# Patient Record
Sex: Female | Born: 1975 | Race: White | Hispanic: No | Marital: Married | Smoking: Former smoker
Health system: Southern US, Community
[De-identification: ages and names within clinical notes are randomized; demographics above are authoritative.]

## PROBLEM LIST (undated history)

## (undated) HISTORY — PX: ABDOMINAL HYSTERECTOMY: SHX81

---

## 2013-12-28 ENCOUNTER — Ambulatory Visit: Payer: Self-pay

## 2015-11-05 ENCOUNTER — Ambulatory Visit
Admission: EM | Admit: 2015-11-05 | Discharge: 2015-11-05 | Disposition: A | Discharge status: Home / Self Care | Payer: 59 | Attending: Family Medicine | Admitting: Family Medicine

## 2015-11-05 ENCOUNTER — Encounter: Payer: Self-pay | Admitting: Emergency Medicine

## 2015-11-05 ENCOUNTER — Ambulatory Visit (INDEPENDENT_AMBULATORY_CARE_PROVIDER_SITE_OTHER): Payer: 59

## 2015-11-05 DIAGNOSIS — S40021A Contusion of right upper arm, initial encounter: Secondary | ICD-10-CM

## 2015-11-05 MED ORDER — HYDROCODONE-ACETAMINOPHEN 5-325 MG PO TABS: ORAL_TABLET | ORAL | Status: DC

## 2015-11-05 MED ORDER — KETOROLAC TROMETHAMINE 60 MG/2ML IM SOLN
60.0000 mg | Freq: Once | INTRAMUSCULAR | Status: AC
  Administered 2015-11-05: 60 mg via INTRAMUSCULAR

## 2015-11-05 NOTE — Discharge Instructions (Signed)

## 2015-11-05 NOTE — ED Provider Notes (Signed)
CSN: [id number]     Arrival date & time 11/05/15  1247 History   First MD Initiated Contact with Patient 11/05/15 1328     Chief Complaint  Patient presents with  . Arm Injury   (Consider location/radiation/quality/duration/timing/severity/associated sxs/prior Treatment) Patient is a 40 y.o. female presenting with arm injury. The history is provided by the patient.  Arm Injury Location:  Arm and elbow Time since incident:  4 hours Injury: yes   Mechanism of injury: fall   Fall:    Fall occurred:  Standing   Impact surface:  Hard floor   Point of impact: right elbow and upper arm (humerus)   Entrapped after fall: no   Arm location:  R upper arm Elbow location:  R elbow Pain details:    Quality:  Aching   Radiates to:  Does not radiate   Severity:  Moderate   Onset quality:  Sudden   Timing:  Constant   Progression:  Unchanged Chronicity:  New Handedness:  Right-handed Dislocation: no   Foreign body present:  No foreign bodies Prior injury to area:  No Relieved by:  None tried Ineffective treatments:  None tried Associated symptoms: swelling   Associated symptoms: no decreased range of motion, no numbness and no tingling     History reviewed. No pertinent past medical history. History reviewed. No pertinent past surgical history. No family history on file. Social History  Substance Use Topics  . Smoking status: Current Every Day Smoker  . Smokeless tobacco: None  . Alcohol Use: No   OB History    No data available     Review of Systems  Allergies  Review of patient's allergies indicates no known allergies.  Home Medications   Prior to Admission medications   Medication Sig Start Date End Date Taking? Authorizing Provider  HYDROcodone-acetaminophen (NORCO/VICODIN) 5-325 MG tablet 1-2 tabs po q 8 hours prn 11/05/15   Payton Mccallumrlando Young Mulvey, MD   Meds Ordered and Administered this Visit   Medications  ketorolac (TORADOL) injection 60 mg (60 mg Intramuscular Given  11/05/15 1343)    BP 107/72 mmHg  Pulse 89  Temp(Src) 98.5 F (36.9 C) (Tympanic)  Resp 18  Ht 5\' 6"  (1.676 m)  Wt 125 lb (56.7 kg)  BMI 20.19 kg/m2  SpO2 99% No data found.   Physical Exam  Constitutional: She appears well-developed and well-nourished. No distress.  Musculoskeletal:       Right shoulder: Normal.       Right elbow: She exhibits swelling. She exhibits normal range of motion, no effusion, no deformity and no laceration. Tenderness found. Lateral epicondyle and olecranon process tenderness noted.  Tenderness to palpation over the mid to distal humerus area; right upper extremity neurovascularly intact  Skin: She is not diaphoretic.  Nursing note and vitals reviewed.   ED Course  Procedures (including critical care time)  Labs Review Labs Reviewed - No data to display  Imaging Review Dg Elbow Complete Right  11/05/2015  CLINICAL DATA:  Fall.  Arm pain EXAM: RIGHT ELBOW - COMPLETE 3+ VIEW COMPARISON:  None. FINDINGS: There is no evidence of fracture, dislocation, or joint effusion. There is no evidence of arthropathy or other focal bone abnormality. Soft tissues are unremarkable. IMPRESSION: Negative. Electronically Signed   By: Marlan Palauharles  Clark M.D.   On: 11/05/2015 14:27   Dg Humerus Right  11/05/2015  CLINICAL DATA:  Fall.  Arm pain EXAM: RIGHT HUMERUS - 2+ VIEW COMPARISON:  None. FINDINGS: There is no evidence of  fracture or other focal bone lesions. Soft tissues are unremarkable. IMPRESSION: Negative. Electronically Signed   By: Marlan Palauharles  Clark M.D.   On: 11/05/2015 14:26     Visual Acuity Review  Right Eye Distance:   Left Eye Distance:   Bilateral Distance:    Right Eye Near:   Left Eye Near:    Bilateral Near:         MDM   1. Contusion, arm, upper, right, initial encounter    New Prescriptions   HYDROCODONE-ACETAMINOPHEN (NORCO/VICODIN) 5-325 MG TABLET    1-2 tabs po q 8 hours prn   1. x-ray results (negative) and diagnosis reviewed with  patient 2. rx as per orders above; reviewed possible side effects, interactions, risks and benefits  3. Recommend supportive treatment with rest, ice, otc NSAIDS/analgesics 4. Follow-up prn if symptoms worsen or don't improve    Payton Mccallumrlando Falisha Osment, MD 11/05/15 1440

## 2015-11-05 NOTE — ED Notes (Signed)
Pt presents with upper right ext pain, states she fell today while trying to hang a curtain. No deformity noted, CMS intact.

## 2016-01-21 ENCOUNTER — Ambulatory Visit
Admission: EM | Admit: 2016-01-21 | Discharge: 2016-01-21 | Disposition: A | Discharge status: Home / Self Care | Payer: 59 | Attending: Family Medicine | Admitting: Family Medicine

## 2016-01-21 DIAGNOSIS — J40 Bronchitis, not specified as acute or chronic: Secondary | ICD-10-CM

## 2016-01-21 DIAGNOSIS — M79601 Pain in right arm: Secondary | ICD-10-CM

## 2016-01-21 MED ORDER — PREDNISONE 10 MG (21) PO TBPK: ORAL_TABLET | ORAL | 0 refills | Status: DC

## 2016-01-21 MED ORDER — ALBUTEROL SULFATE HFA 108 (90 BASE) MCG/ACT IN AERS: 2.0000 | INHALATION_SPRAY | Freq: Four times a day (QID) | RESPIRATORY_TRACT | 0 refills | Status: AC | PRN

## 2016-01-21 MED ORDER — BENZONATATE 100 MG PO CAPS: 100.0000 mg | ORAL_CAPSULE | Freq: Three times a day (TID) | ORAL | 0 refills | Status: AC | PRN

## 2016-01-21 NOTE — ED Triage Notes (Signed)
Patient complains of a cough that started around 5 days ago. Patient states that she is also having right arm pain, was seen here after falling 2 months ago. Reports that when she started coughing she started having the arm pain again and she has been unable to move arm at certain angles. Patient states that the pain is keeping her up at night.

## 2016-01-21 NOTE — ED Provider Notes (Signed)
CSN: [id number]     Arrival date & time 01/21/16  1323 History   First MD Initiated Contact with Patient 01/21/16 1341     Chief Complaint  Patient presents with  . Cough  . Arm Pain   (Consider location/radiation/quality/duration/timing/severity/associated sxs/prior Treatment) 40 year old female presents with cough and chest congestion for the past 5 to 6 days. She also had nasal congestion and ear pain which has resolved. She denies any fever or GI symptoms. She also complains of right arm/shoulder pain for the past 4 days. This started after a bad coughing episode. The pain is worse at night. She was unable to move her right arm due to pain at first but now she can raise her arm mid-way. She has taken OTC cold medication and Ibuprofen for pain with minimal relief. She did fall and injure her right arm about 3 months ago but had fully recovered until now.    The history is provided by the patient.  Cough  Cough characteristics:  Productive Sputum characteristics:  Yellow Severity:  Moderate Onset quality:  Gradual Duration:  6 days Timing:  Constant Progression:  Worsening Chronicity:  New Smoker: yes   Context: upper respiratory infection   Relieved by:  Nothing Worsened by:  Lying down Ineffective treatments:  Rest and cough suppressants Associated symptoms: ear pain, sinus congestion and wheezing   Associated symptoms: no chest pain, no chills, no fever, no headaches, no rash, no shortness of breath and no sore throat   Arm Pain  Pertinent negatives include no chest pain, no abdominal pain, no headaches and no shortness of breath.    History reviewed. No pertinent past medical history. Past Surgical History:  Procedure Laterality Date  . ABDOMINAL HYSTERECTOMY     History reviewed. No pertinent family history. Social History  Substance Use Topics  . Smoking status: Current Every Day Smoker    Packs/day: 0.50  . Smokeless tobacco: Never Used  . Alcohol use No   OB  History    No data available     Review of Systems  Constitutional: Positive for fatigue. Negative for activity change, chills and fever.  HENT: Positive for congestion and ear pain. Negative for sinus pressure and sore throat.   Respiratory: Positive for cough and wheezing. Negative for chest tightness and shortness of breath.   Cardiovascular: Negative for chest pain.  Gastrointestinal: Negative for abdominal pain, diarrhea, nausea and vomiting.  Musculoskeletal: Positive for arthralgias (right arm/shoulder). Negative for neck pain and neck stiffness.  Skin: Negative for rash.  Neurological: Negative for dizziness, numbness and headaches.    Allergies  Review of patient's allergies indicates no known allergies.  Home Medications   Prior to Admission medications   Medication Sig Start Date End Date Taking? Authorizing Provider  estrogens conjugated, synthetic A, (CENESTIN) 1.25 MG tablet Take 1.25 mg by mouth daily.   Yes Historical Provider, MD  albuterol (PROVENTIL HFA;VENTOLIN HFA) 108 (90 Base) MCG/ACT inhaler Inhale 2 puffs into the lungs every 6 (six) hours as needed for wheezing. 01/21/16   Sudie Grumbling, NP  benzonatate (TESSALON) 100 MG capsule Take 1 capsule (100 mg total) by mouth 3 (three) times daily as needed for cough. 01/21/16   Sudie Grumbling, NP  predniSONE (STERAPRED UNI-PAK 21 TAB) 10 MG (21) TBPK tablet Take 6 tabs by mouth daily  for 2 days, then 5 tabs for 2 days, then 4 tabs for 2 days, then 3 tabs for 2 days, 2 tabs for  2 days, then 1 tab by mouth daily until finished. 01/21/16   Sudie GrumblingAnn Berry Shivonne Schwartzman, NP   Meds Ordered and Administered this Visit  Medications - No data to display  BP 103/70 (BP Location: Left Arm)   Pulse 81   Temp 98.1 F (36.7 C) (Oral)   Resp 16   Ht 5\' 7"  (1.702 m)   Wt 125 lb (56.7 kg)   SpO2 95%   BMI 19.58 kg/m  No data found.   Physical Exam  Constitutional: She is oriented to person, place, and time. She appears well-developed  and well-nourished. She is cooperative. No distress.  HENT:  Head: Normocephalic and atraumatic.  Right Ear: Hearing, tympanic membrane, external ear and ear canal normal.  Left Ear: Hearing, tympanic membrane, external ear and ear canal normal.  Nose: Rhinorrhea present. Right sinus exhibits no maxillary sinus tenderness and no frontal sinus tenderness. Left sinus exhibits no maxillary sinus tenderness and no frontal sinus tenderness.  Mouth/Throat: Uvula is midline, oropharynx is clear and moist and mucous membranes are normal.  Neck: Trachea normal, normal range of motion and full passive range of motion without pain. Neck supple.  Cardiovascular: Normal rate, regular rhythm and normal heart sounds.   Pulmonary/Chest: Effort normal. She has decreased breath sounds. She has wheezes in the right upper field, the right middle field and the right lower field. She has no rhonchi. She has no rales.  Musculoskeletal: She exhibits tenderness.       Right shoulder: She exhibits decreased range of motion, tenderness and pain. She exhibits no swelling, no effusion, no crepitus, no deformity, no spasm, normal pulse and normal strength.  Right arm tender along deltoid and triceps muscles. Has marked decreased range of motion of right shoulder- only able to abduct about 40 degrees. Has full range of motion of elbow and wrist. Also tender along right trapezius muscle. No distinct muscle spasms present. Pulses are normal and no neuro deficits noted.   Lymphadenopathy:    She has no cervical adenopathy.  Neurological: She is alert and oriented to person, place, and time. She has normal strength and normal reflexes. No sensory deficit.  Skin: Skin is warm and dry. Capillary refill takes less than 2 seconds.  Psychiatric: Her behavior is normal. Judgment and thought content normal. Her mood appears anxious. Her speech is rapid and/or pressured. Cognition and memory are normal.    Urgent Care Course   Clinical  Course    Procedures (including critical care time)  Labs Review Labs Reviewed - No data to display  Imaging Review No results found.   Visual Acuity Review  Right Eye Distance:   Left Eye Distance:   Bilateral Distance:    Right Eye Near:   Left Eye Near:    Bilateral Near:         MDM   1. Bronchitis   2. Right arm pain    Discussed with patient that she probably has bronchitis- most likely viral. Recommend Albuterol inhaler 2 puffs every 6 hours as needed for wheezing and Tessalon perles 3 times a day as needed for cough. Encouraged to decrease/stop smoking. Reviewed that arm pain is probably due to muscle strain and nerve pain from coughing or sleeping on arm/shoulder incorrectly. Recommend try Prednisone 10mg  12 day dose pack as directed. Encouraged to continue to move arm/shoulder as tolerated. Discussed imaging not necessary at this time. Recommend follow-up with a primary care provider within 1 week if symptoms are not improving or go  to ER if symptoms worsen.    Sudie Grumbling, NP 01/22/16 [phone]

## 2016-01-21 NOTE — Discharge Instructions (Signed)
Start Prednisone 6 tablets today and take as directed. Take Tessalon perles (cough pills) every 8 hours as needed for cough. Use Albuterol inhaler every 6 hours as needed for cough and wheezing. Follow-up with a primary care provider if not improving within 3 to 4 days.

## 2016-11-12 IMAGING — CR DG ELBOW COMPLETE 3+V*R*
4 series · 4 of 4 positions shown · non-contrast
Comparison: None.

CLINICAL DATA: Fall.  Arm pain

EXAM:
RIGHT ELBOW - COMPLETE 3+ VIEW

[elbow ap]
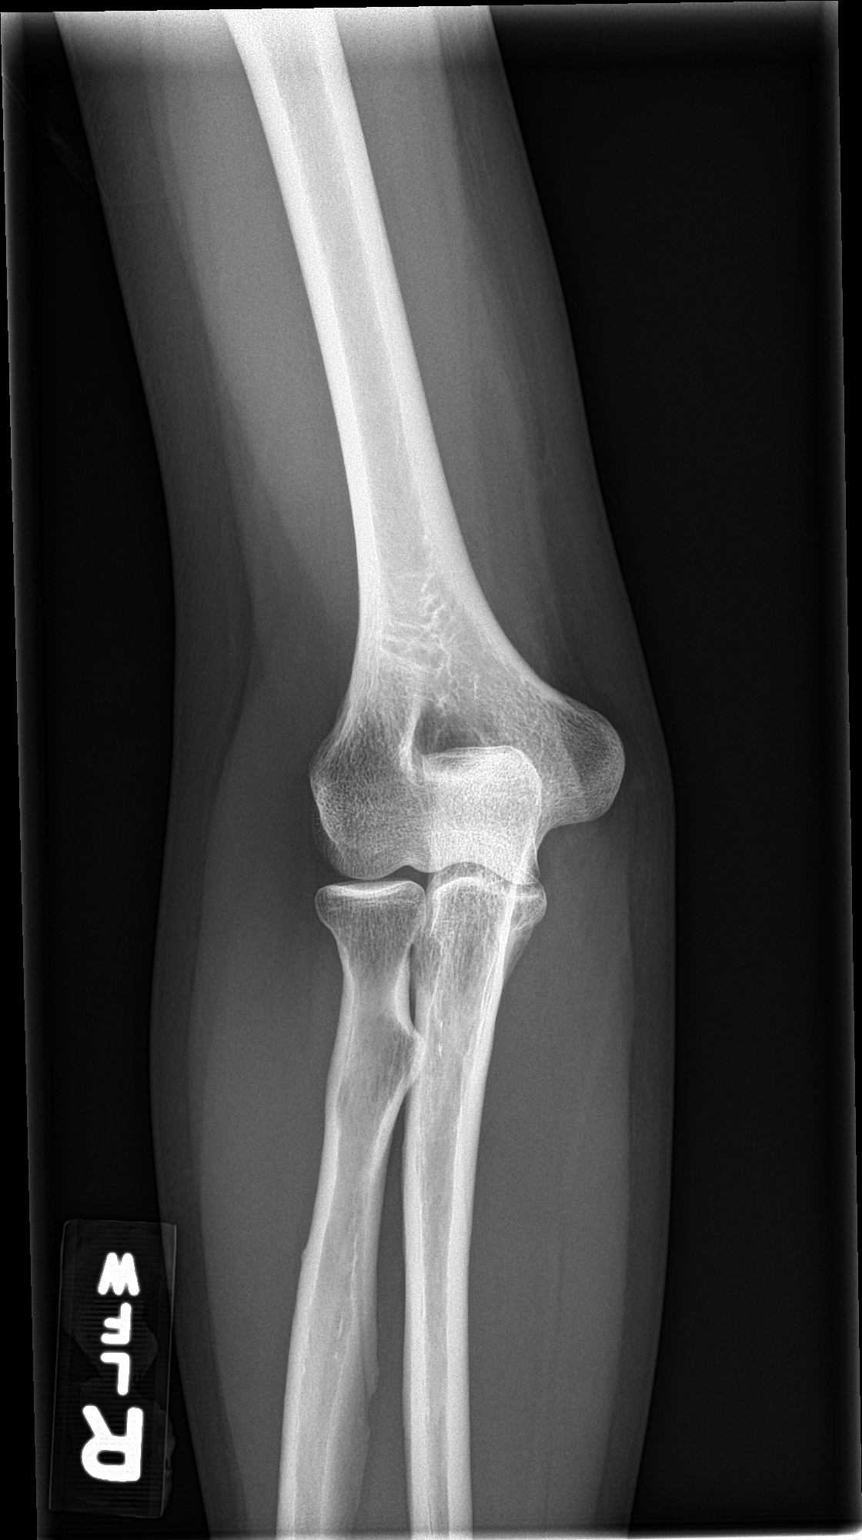

[elbow obl (1 of 2)]
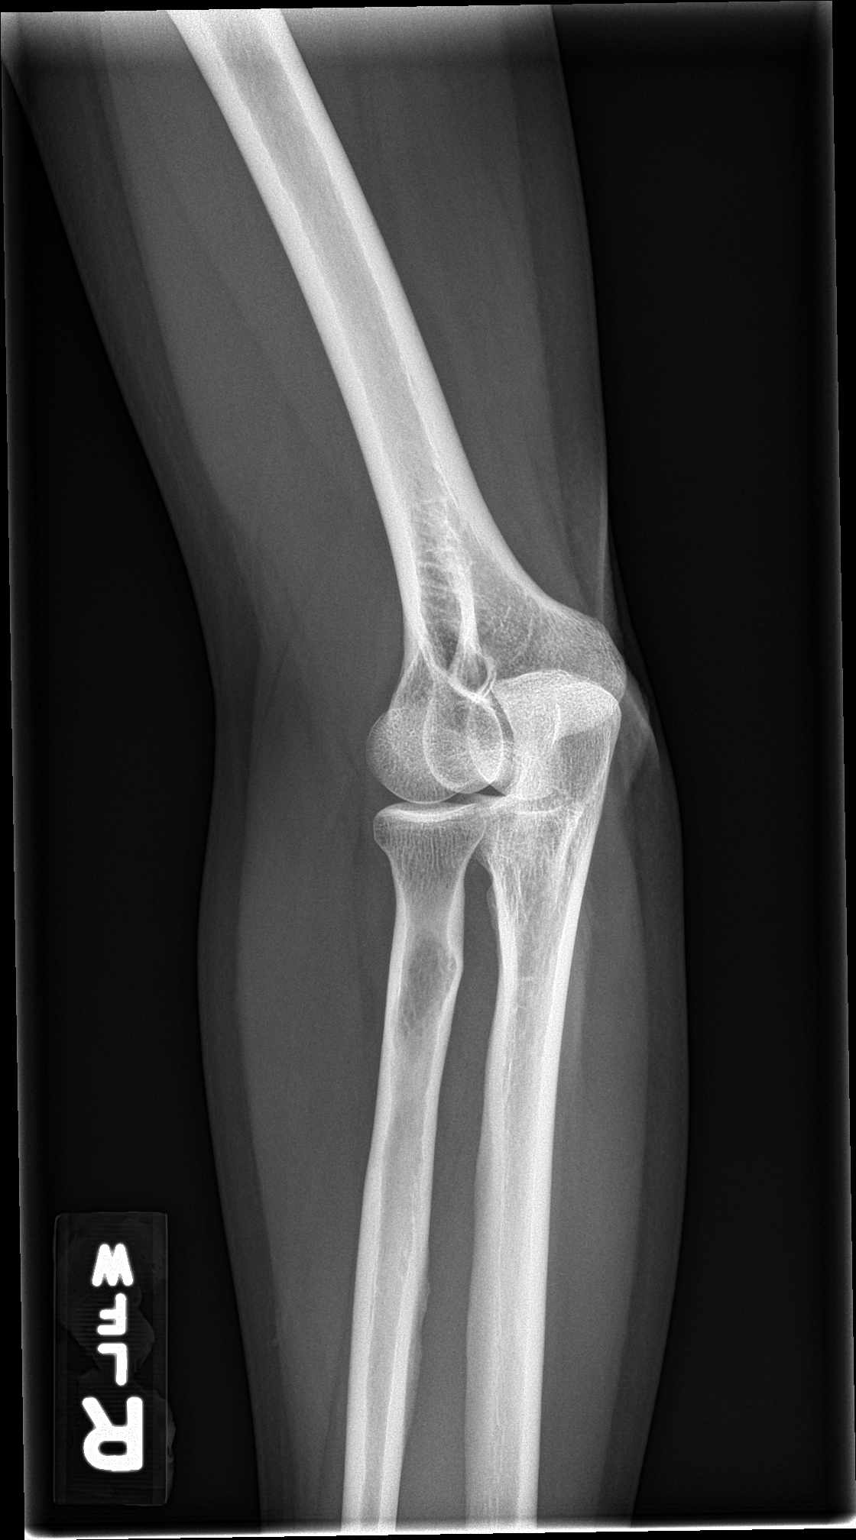

[elbow obl (2 of 2)]
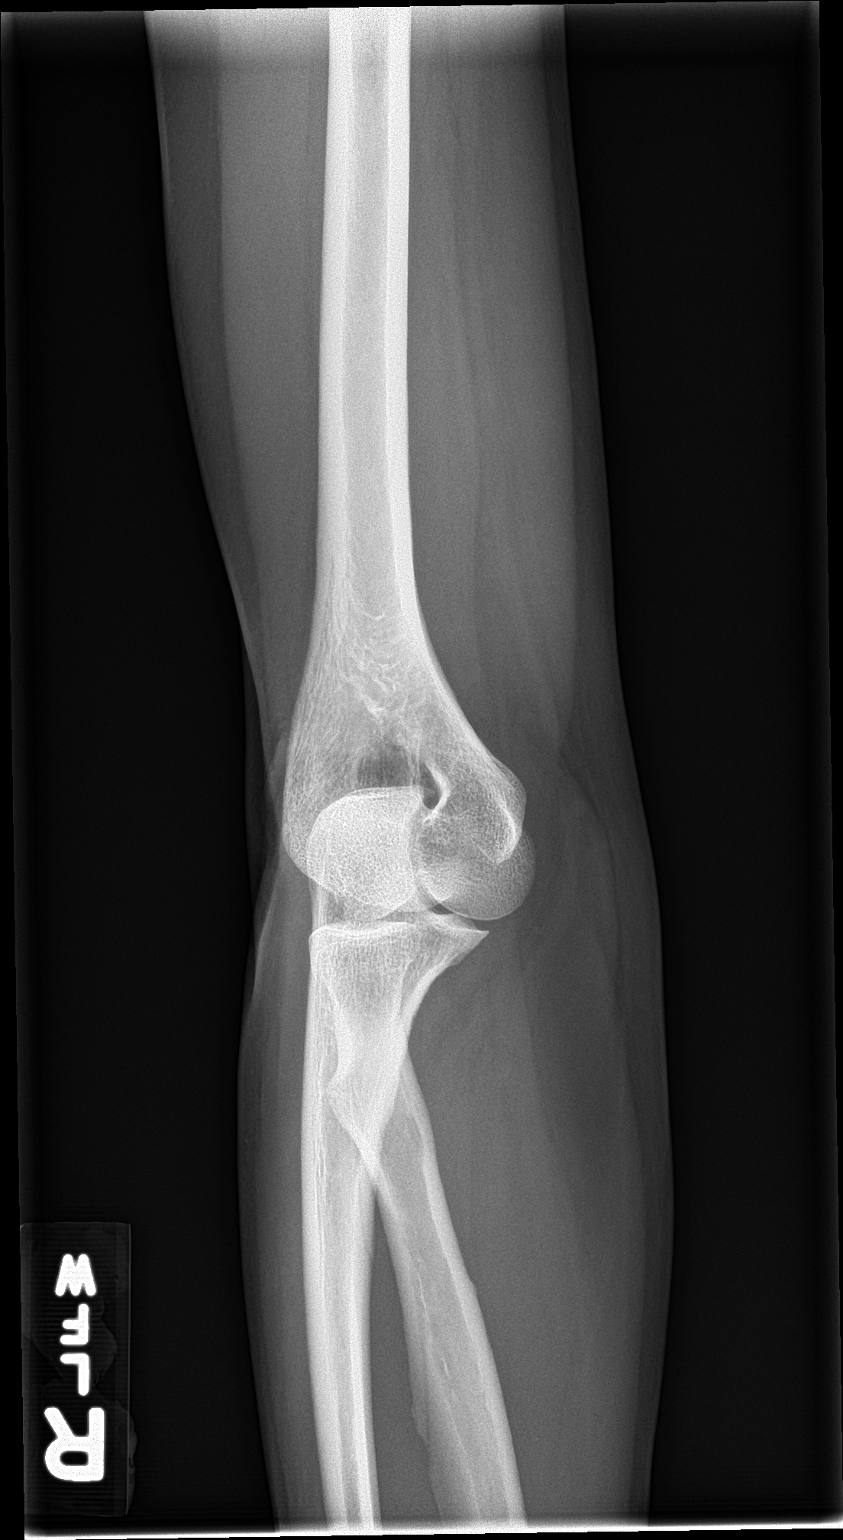

[elbow lat]
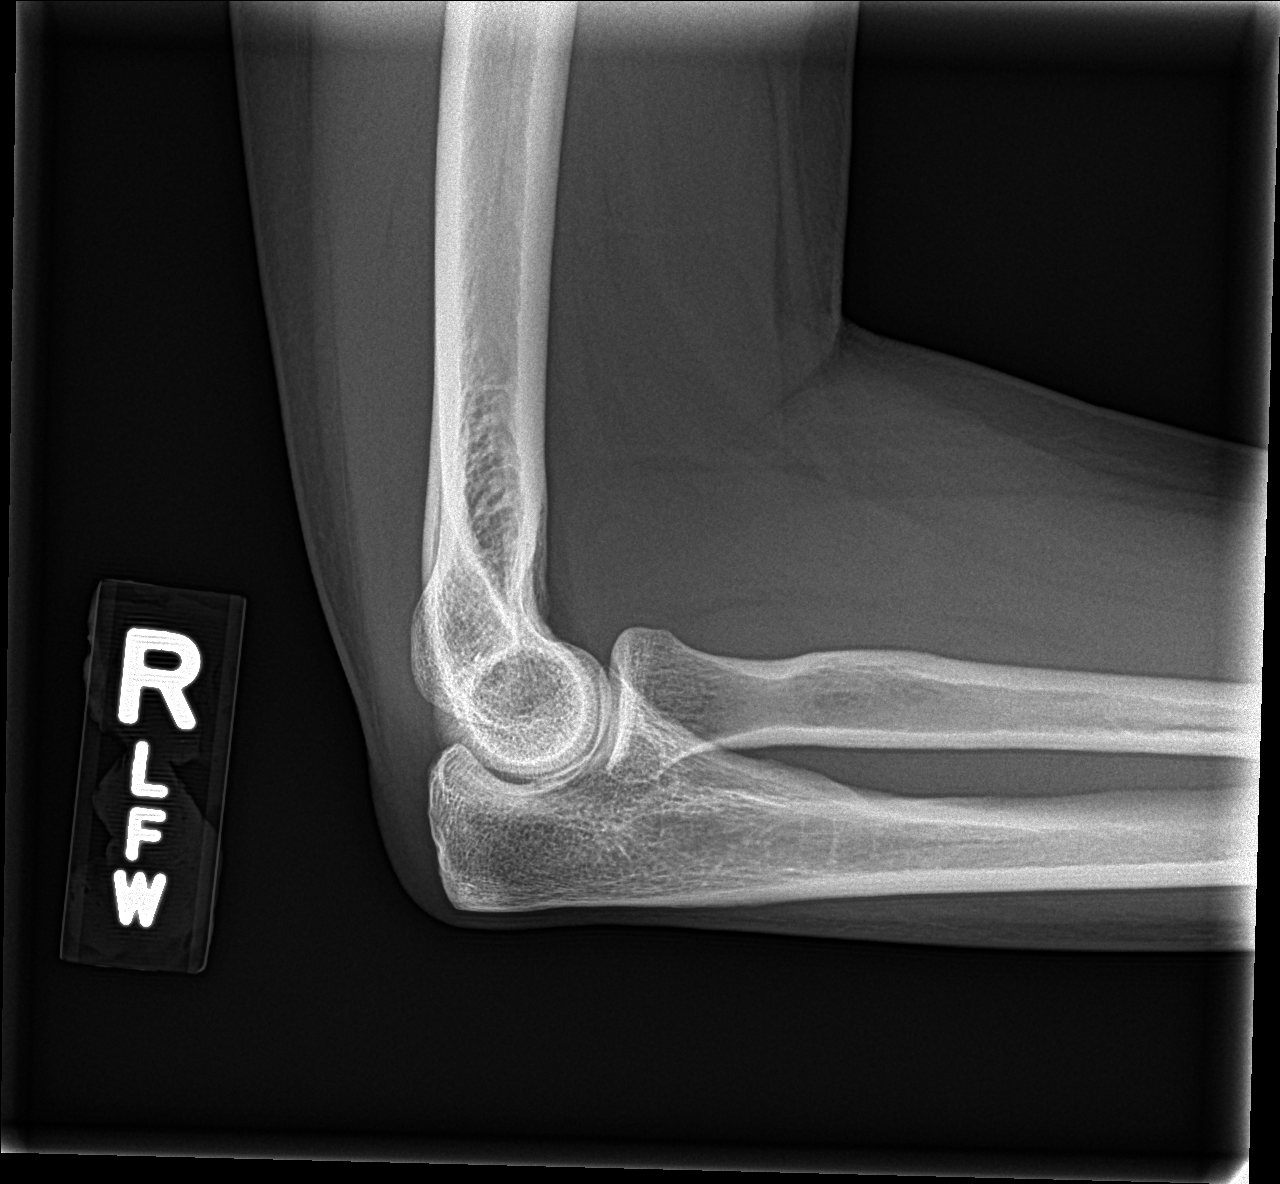

[4 of 4 positions shown; findings below may reference images not displayed]

FINDINGS: There is no evidence of fracture, dislocation, or joint effusion.
There is no evidence of arthropathy or other focal bone abnormality.
Soft tissues are unremarkable.
IMPRESSION: Negative.

## 2018-11-01 ENCOUNTER — Ambulatory Visit
Admission: EM | Admit: 2018-11-01 | Discharge: 2018-11-01 | Disposition: A | Discharge status: Home / Self Care | Payer: Managed Care, Other (non HMO) | Attending: Emergency Medicine | Admitting: Emergency Medicine

## 2018-11-01 ENCOUNTER — Other Ambulatory Visit: Payer: Self-pay

## 2018-11-01 ENCOUNTER — Encounter: Payer: Self-pay | Admitting: Emergency Medicine

## 2018-11-01 DIAGNOSIS — R0789 Other chest pain: Secondary | ICD-10-CM

## 2018-11-01 DIAGNOSIS — R109 Unspecified abdominal pain: Secondary | ICD-10-CM

## 2018-11-01 LAB — URINALYSIS, COMPLETE (UACMP) WITH MICROSCOPIC
Bacteria, UA: NONE SEEN
Bilirubin Urine: NEGATIVE
Glucose, UA: NEGATIVE mg/dL
Ketones, ur: NEGATIVE mg/dL
Leukocytes,Ua: NEGATIVE
Nitrite: NEGATIVE
Protein, ur: NEGATIVE mg/dL
Specific Gravity, Urine: >1.03 — ABNORMAL HIGH (ref 1.005–1.030)
WBC, UA: NONE SEEN WBC/hpf (ref 0–5)
pH: 6 (ref 5.0–8.0)

## 2018-11-01 MED ORDER — KETOROLAC TROMETHAMINE 60 MG/2ML IM SOLN
60.0000 mg | Freq: Once | INTRAMUSCULAR | Status: AC
  Administered 2018-11-01: 60 mg via INTRAMUSCULAR

## 2018-11-01 MED ORDER — MELOXICAM 7.5 MG PO TABS: 7.5000 mg | ORAL_TABLET | Freq: Every day | ORAL | 0 refills | Status: DC

## 2018-11-01 MED ORDER — CYCLOBENZAPRINE HCL 5 MG PO TABS: 5.0000 mg | ORAL_TABLET | Freq: Every day | ORAL | 0 refills | Status: DC

## 2018-11-01 NOTE — ED Triage Notes (Signed)
Patient states she developed right side/flank pain last Thursday.  Is not able to sleep or get comfortable

## 2018-11-01 NOTE — Discharge Instructions (Signed)
Increase your fluid intake.  Meloxicam daily, take with food.  Flexeril at night as needed. May cause drowsiness. Please do not take if driving or drinking alcohol.   I think kidney stone is still in the realm of possibility so if no improvement, development of urinary symptoms, fevers, or otherwise worsening please return or go to the ER.

## 2018-11-01 NOTE — ED Provider Notes (Signed)
[facility]    CSN: [id number] Arrival date & time: 11/01/18  1413     History   Chief Complaint Chief Complaint  Patient presents with  . Flank Pain    HPI Cindy Dillon is a 43 y.o. female.   Cindy Dillon presents with complaints of  Right sided flank pain. This started approximately 4 days ago. She had woken up coughing. The following morning noted the pain. It is worse at night and improves some during the day. Movements worsen the pain. States it feels like a "Charlie horse."  Pain is worse while moving, she notices it while in bed especially. Turning in bed is painful. She has taken tylenol and ibuprofen, ibuprofen has helped some. She has applied a topical cream to help with pain as well which minimally helped. No urinary symptoms. No fevers. Has had a kidney stone in the past, states this is not similar and not as severe. No nausea or vomiting.    ROS per HPI, negative if not otherwise mentioned.      History reviewed. No pertinent past medical history.  There are no active problems to display for this patient.   Past Surgical History:  Procedure Laterality Date  . ABDOMINAL HYSTERECTOMY      OB History   No obstetric history on file.      Home Medications    Prior to Admission medications   Medication Sig Start Date End Date Taking? Authorizing Provider  estrogens conjugated, synthetic A, (CENESTIN) 1.25 MG tablet Take 1.25 mg by mouth daily.   Yes [provider]  albuterol (PROVENTIL HFA;VENTOLIN HFA) 108 (90 Base) MCG/ACT inhaler Inhale 2 puffs into the lungs every 6 (six) hours as needed for wheezing. 01/21/16   Sudie GrumblingAmyot, Ann Berry, NP  benzonatate (TESSALON) 100 MG capsule Take 1 capsule (100 mg total) by mouth 3 (three) times daily as needed for cough. 01/21/16   Sudie GrumblingAmyot, Ann Berry, NP  cyclobenzaprine (FLEXERIL) 5 MG tablet Take 1 tablet (5 mg total) by mouth at bedtime. 11/01/18   Georgetta HaberBurky, Mayela Bullard B, NP  meloxicam (MOBIC) 7.5  MG tablet Take 1 tablet (7.5 mg total) by mouth daily. 11/01/18   Georgetta HaberBurky, Trenell Concannon B, NP  predniSONE (STERAPRED UNI-PAK 21 TAB) 10 MG (21) TBPK tablet Take 6 tabs by mouth daily  for 2 days, then 5 tabs for 2 days, then 4 tabs for 2 days, then 3 tabs for 2 days, 2 tabs for 2 days, then 1 tab by mouth daily until finished. 01/21/16   Sudie GrumblingAmyot, Ann Berry, NP    Family History Family History  Problem Relation Age of Onset  . Cancer Mother   . Diabetes Maternal Grandmother   . Cancer Paternal Grandmother     Social History Social History   Tobacco Use  . Smoking status: Former Smoker    Packs/day: 0.50    Quit date: 02/09/2017    Years since quitting: 1.7  . Smokeless tobacco: Never Used  Substance Use Topics  . Alcohol use: No  . Drug use: No     Allergies   Patient has no known allergies.   Review of Systems Review of Systems   Physical Exam Triage Vital Signs ED Triage Vitals  Enc Vitals Group     BP 11/01/18 1443 113/71     Pulse Rate 11/01/18 1443 80     Resp 11/01/18 1443 18     Temp 11/01/18 1443 98.5 F (36.9 C)     Temp  src --      SpO2 11/01/18 1443 96 %     Weight 11/01/18 1437 137 lb (62.1 kg)     Height 11/01/18 1437 5\' 7"  (1.702 m)     Head Circumference --      Peak Flow --      Pain Score 11/01/18 1437 5     Pain Loc --      Pain Edu? --      Excl. in [place]? --    No data found.  Updated Vital Signs BP 113/71 (BP Location: Left Arm)   Pulse 80   Temp 98.5 F (36.9 C)   Resp 18   Ht 5\' 7"  (1.702 m)   Wt 137 lb (62.1 kg)   SpO2 96%   BMI 21.46 kg/m    Physical Exam Constitutional:      General: She is not in acute distress.    Appearance: She is well-developed.  Cardiovascular:     Rate and Rhythm: Normal rate.  Pulmonary:     Effort: Pulmonary effort is normal.  Musculoskeletal:       Arms:     Comments: Right flank with superficial musculature tenderness and point tenderness on palpation; pain with transitioning from sit to lay and lay  to sit on exam table and engaging her core; no abdominal pain; no rib bony pain; question cva tenderness although discomfort in this region is quite superficial   Skin:    General: Skin is warm and dry.  Neurological:     Mental Status: She is alert and oriented to person, place, and time.      UC Treatments / Results  Labs (all labs ordered are listed, but only abnormal results are displayed) Labs Reviewed  URINALYSIS, COMPLETE (UACMP) WITH MICROSCOPIC - Abnormal; Notable for the following components:      Result Value   Specific Gravity, Urine >1.030 (*)    Hgb urine dipstick TRACE (*)    All other components within normal limits    EKG None  Radiology No results found.  Procedures Procedures (including critical care time)  Medications Ordered in UC Medications  ketorolac (TORADOL) injection 60 mg (60 mg Intramuscular Given 11/01/18 1558)    Initial Impression / Assessment and Plan / UC Course  I have reviewed the triage vital signs and the nursing notes.  Pertinent labs & imaging results that were available during my care of the patient were reviewed by me and considered in my medical decision making (see chart for details).     Trace hgb to urine in presence of flank pain and tenderness. Discussed kidney stone differential with patient. Pain with engaging affect muscles, and worse with movement. Patient insistent that this does not feel like the kidney stones she has had in the past. Will provided supportive cares at this time, with recommendations for follow up if persistent, as well as return precautions. Patient verbalized understanding and agreeable to plan.   Final Clinical Impressions(s) / UC Diagnoses   Final diagnoses:  Right-sided chest wall pain     Discharge Instructions     Increase your fluid intake.  Meloxicam daily, take with food.  Flexeril at night as needed. May cause drowsiness. Please do not take if driving or drinking alcohol.   I think  kidney stone is still in the realm of possibility so if no improvement, development of urinary symptoms, fevers, or otherwise worsening please return or go to the ER.     ED Prescriptions  Medication Sig Dispense Auth. Provider   meloxicam (MOBIC) 7.5 MG tablet Take 1 tablet (7.5 mg total) by mouth daily. 20 tablet Linus MakoBurky, Abril Cappiello B, NP   cyclobenzaprine (FLEXERIL) 5 MG tablet Take 1 tablet (5 mg total) by mouth at bedtime. 15 tablet Georgetta HaberBurky, Subrena Devereux B, NP     Controlled Substance Prescriptions [place] Controlled Substance Registry consulted? Not Applicable   Georgetta HaberBurky, Lisa Milian B, NP 11/01/18 2158

## 2022-01-30 ENCOUNTER — Ambulatory Visit
Admission: EM | Admit: 2022-01-30 | Discharge: 2022-01-30 | Disposition: A | Discharge status: Home / Self Care | Payer: 59 | Attending: Emergency Medicine | Admitting: Emergency Medicine

## 2022-01-30 DIAGNOSIS — S46811A Strain of other muscles, fascia and tendons at shoulder and upper arm level, right arm, initial encounter: Secondary | ICD-10-CM

## 2022-01-30 MED ORDER — PREDNISONE 10 MG (21) PO TBPK: ORAL_TABLET | ORAL | 0 refills | Status: DC

## 2022-01-30 MED ORDER — CYCLOBENZAPRINE HCL 5 MG PO TABS: 5.0000 mg | ORAL_TABLET | Freq: Every day | ORAL | 0 refills | Status: AC

## 2022-01-30 MED ORDER — KETOROLAC TROMETHAMINE 60 MG/2ML IM SOLN
30.0000 mg | Freq: Once | INTRAMUSCULAR | Status: AC
  Administered 2022-01-30: 30 mg via INTRAMUSCULAR

## 2022-01-30 NOTE — ED Triage Notes (Addendum)
Pt c/o right shoulder and arm pain x1week  Pt states she did some yard work (Digging holes to plant trees) last week and since then she has had a pinching sensation in the shoulder and pain, numbness, and tingling in the arm whenever she bends over or raises her arm.   Pt states that the pain has gotten worse over the last week and gradually worsens throughout the day.

## 2022-01-30 NOTE — ED Provider Notes (Signed)
[facility]    CSN: [id number] Arrival date & time: 01/30/22  1809      History   Chief Complaint Chief Complaint  Patient presents with   Arm Pain    HPI Cindy Dillon is a 46 y.o. female.   Patient presents with right-sided shoulder pain present for 7 days.  Symptoms began after doing holes with a shovel and planting trees in her yard.  Pain has been constant with intermittent tingling sensation radiating down the right arm.  Tingling is improved with movement such as raising the arm above the head.  Has attempted use of ibuprofen and exercises that she found on the Internet which have been ineffective.  Denies numbness, neck pain, prior injury or trauma.  History reviewed. No pertinent past medical history.  There are no problems to display for this patient.   Past Surgical History:  Procedure Laterality Date   ABDOMINAL HYSTERECTOMY      OB History   No obstetric history on file.      Home Medications    Prior to Admission medications   Medication Sig Start Date End Date Taking? Authorizing Provider  estrogens conjugated, synthetic A, (CENESTIN) 1.25 MG tablet Take 1.25 mg by mouth daily.   Yes [provider]  albuterol (PROVENTIL HFA;VENTOLIN HFA) 108 (90 Base) MCG/ACT inhaler Inhale 2 puffs into the lungs every 6 (six) hours as needed for wheezing. 01/21/16   Katy Apo, NP  benzonatate (TESSALON) 100 MG capsule Take 1 capsule (100 mg total) by mouth 3 (three) times daily as needed for cough. 01/21/16   Katy Apo, NP  cyclobenzaprine (FLEXERIL) 5 MG tablet Take 1 tablet (5 mg total) by mouth at bedtime. 11/01/18   Zigmund Gottron, NP  meloxicam (MOBIC) 7.5 MG tablet Take 1 tablet (7.5 mg total) by mouth daily. 11/01/18   Zigmund Gottron, NP  predniSONE (STERAPRED UNI-PAK 21 TAB) 10 MG (21) TBPK tablet Take 6 tabs by mouth daily  for 2 days, then 5 tabs for 2 days, then 4 tabs for 2 days, then 3 tabs for 2 days, 2 tabs for 2  days, then 1 tab by mouth daily until finished. 01/21/16   AmyotNicholes Stairs, NP    Family History Family History  Problem Relation Age of Onset   Cancer Mother    Diabetes Maternal Grandmother    Cancer Paternal Grandmother     Social History Social History   Tobacco Use   Smoking status: Former    Packs/day: 0.50    Types: Cigarettes    Quit date: 02/09/2017    Years since quitting: 4.9   Smokeless tobacco: Never  Vaping Use   Vaping Use: Never used  Substance Use Topics   Alcohol use: No   Drug use: No     Allergies   Patient has no known allergies.   Review of Systems Review of Systems  Constitutional: Negative.   Respiratory: Negative.    Cardiovascular: Negative.   Musculoskeletal:  Positive for myalgias. Negative for arthralgias, back pain, gait problem, joint swelling, neck pain and neck stiffness.  Skin: Negative.   Neurological: Negative.      Physical Exam Triage Vital Signs ED Triage Vitals  Enc Vitals Group     BP 01/30/22 1819 120/83     Pulse Rate 01/30/22 1819 84     Resp 01/30/22 1819 18     Temp 01/30/22 1819 98.6 F (37 C)     Temp  Source 01/30/22 1819 Oral     SpO2 01/30/22 1819 95 %     Weight 01/30/22 1817 125 lb (56.7 kg)     Height 01/30/22 1817 5\' 7"  (1.702 m)     Head Circumference --      Peak Flow --      Pain Score 01/30/22 1817 7     Pain Loc --      Pain Edu? --      Excl. in [place]? --    No data found.  Updated Vital Signs BP 120/83 (BP Location: Left Arm)   Pulse 84   Temp 98.6 F (37 C) (Oral)   Resp 18   Ht 5\' 7"  (1.702 m)   Wt 125 lb (56.7 kg)   SpO2 95%   BMI 19.58 kg/m   Visual Acuity Right Eye Distance:   Left Eye Distance:   Bilateral Distance:    Right Eye Near:   Left Eye Near:    Bilateral Near:     Physical Exam Constitutional:      Appearance: Normal appearance.  HENT:     Head: Normocephalic.  Eyes:     Extraocular Movements: Extraocular movements intact.  Pulmonary:     Effort:  Pulmonary effort is normal.  Musculoskeletal:     Comments: Tenderness is present over the trapezius muscle without ecchymosis, swelling or deformity, unable to reproduce tenderness over the shoulder and shoulder is without ecchymosis, swelling or deformity, strength is a 4 out of 5, range of motion of the right arm is intact, 2+ carotid and brachial pulse,   no tenderness, ecchymosis swelling noted to the neck and range of motion of the neck is intact  Neurological:     Mental Status: She is alert and oriented to person, place, and time. Mental status is at baseline.  Psychiatric:        Mood and Affect: Mood normal.        Behavior: Behavior normal.      UC Treatments / Results  Labs (all labs ordered are listed, but only abnormal results are displayed) Labs Reviewed - No data to display  EKG   Radiology No results found.  Procedures Procedures (including critical care time)  Medications Ordered in UC Medications - No data to display  Initial Impression / Assessment and Plan / UC Course  I have reviewed the triage vital signs and the nursing notes.  Pertinent labs & imaging results that were available during my care of the patient were reviewed by me and considered in my medical decision making (see chart for details).  Trapezius strain, right, initial encounter  Etiology is most likely muscular, discussed with patient, Toradol injection given in office and prescribed prednisone and Flexeril for outpatient use, recommended RICE, heat, massage, daily stretching and activity as tolerated, may continue exercises if helpful, given walking referral to orthopedics if symptoms persist or worsen Final Clinical Impressions(s) / UC Diagnoses   Final diagnoses:  None   Discharge Instructions   None    ED Prescriptions   None    PDMP not reviewed this encounter.   02/01/22, NP 01/30/22 1844

## 2022-01-30 NOTE — Discharge Instructions (Signed)
Today you are being treated for pain to the trapezius muscle, this is a big muscle in your back that helps and allows your shoulder to move  Today you have been given an injection of Toradol here in the office to reduce inflammation that occurs with injury which in turn will help with your pain  Starting tomorrow take prednisone every morning as directed to continue the above process  You may use muscle relaxer at bedtime for additional comfort, be mindful this medication may make you dry   You may use heating pad in 15 minute intervals as needed for additional comfort, or you may find comfort in using ice in 10-15 minutes over affected area  Begin stretching affected area daily for 10 minutes as tolerated to further loosen muscles   When lying down place pillow underneath underneath arm and behind back    Practice good posture: head back, shoulders back, chest forward, pelvis back and weight distributed evenly on both legs  If pain persist after recommended treatment or reoccurs if may be beneficial to follow up with orthopedic specialist for evaluation, this doctor specializes in the bones and can manage your symptoms long-term with options such as but not limited to imaging, medications or physical therapy

## 2022-07-26 ENCOUNTER — Ambulatory Visit
Admission: EM | Admit: 2022-07-26 | Discharge: 2022-07-26 | Disposition: A | Discharge status: Home / Self Care | Payer: 59 | Attending: Family Medicine | Admitting: Family Medicine

## 2022-07-26 DIAGNOSIS — J0100 Acute maxillary sinusitis, unspecified: Secondary | ICD-10-CM

## 2022-07-26 DIAGNOSIS — H6992 Unspecified Eustachian tube disorder, left ear: Secondary | ICD-10-CM | POA: Diagnosis not present

## 2022-07-26 MED ORDER — AMOXICILLIN-POT CLAVULANATE 875-125 MG PO TABS: 1.0000 | ORAL_TABLET | Freq: Two times a day (BID) | ORAL | 0 refills | Status: DC

## 2022-07-26 MED ORDER — PREDNISONE 10 MG (21) PO TBPK: ORAL_TABLET | Freq: Every day | ORAL | 0 refills | Status: DC

## 2022-07-26 NOTE — Discharge Instructions (Addendum)
Stop by the pharmacy to pick up your prescription.  Follow up with your primary care provider as needed.

## 2022-07-26 NOTE — ED Provider Notes (Signed)
[facility]    CSN: [id number] Arrival date & time: 07/26/22  1445      History   Chief Complaint Chief Complaint  Patient presents with   Facial Pain   Ear Pain    HPI Cindy Dillon is a 47 y.o. female.   HPI   Cindy Dillon presents for left sided dental pain and ear pain that woke her up from sleep this morning. Has stabbing pain. She went to the dentist 4 days  for crown follow up.  Two weeks ago she had a virtual visit for sneezing and watery eyes. She was advised to take psudafed with benadryl and claritine. She brought allergy Flonase. She is breathing much better and is night    Fever : no  Chills: no Sore throat: no   Cough: no Sputum: no Nasal congestion : no  Rhinorrhea: no Myalgias: no Appetite: normal  Hydration: normal  Abdominal pain: no Nausea: no Vomiting: no Diarrhea: No Rash: No Sleep disturbance: no Headache: no      History reviewed. No pertinent past medical history.  There are no problems to display for this patient.   Past Surgical History:  Procedure Laterality Date   ABDOMINAL HYSTERECTOMY      OB History   No obstetric history on file.      Home Medications    Prior to Admission medications   Medication Sig Start Date End Date Taking? Authorizing Provider  albuterol (PROVENTIL HFA;VENTOLIN HFA) 108 (90 Base) MCG/ACT inhaler Inhale 2 puffs into the lungs every 6 (six) hours as needed for wheezing. 01/21/16   Katy Apo, NP  benzonatate (TESSALON) 100 MG capsule Take 1 capsule (100 mg total) by mouth 3 (three) times daily as needed for cough. 01/21/16   Katy Apo, NP  cyclobenzaprine (FLEXERIL) 5 MG tablet Take 1 tablet (5 mg total) by mouth at bedtime. 01/30/22   Hans Eden, NP  estrogens conjugated, synthetic A, (CENESTIN) 1.25 MG tablet Take 1.25 mg by mouth daily.    [provider]  meloxicam (MOBIC) 7.5 MG tablet Take 1 tablet (7.5 mg total) by mouth daily. 11/01/18   Zigmund Gottron, NP  predniSONE (STERAPRED UNI-PAK 21 TAB) 10 MG (21) TBPK tablet Take 6 tabs by mouth daily  for 1 days, then 5 tabs for 1 days, then 4 tabs for 1 days, then 3 tabs for 1 days, 2 tabs for 1 days, then 1 tab by mouth daily until finished. 01/30/22   Hans Eden, NP    Family History Family History  Problem Relation Age of Onset   Cancer Mother    Diabetes Maternal Grandmother    Cancer Paternal Grandmother     Social History Social History   Tobacco Use   Smoking status: Former    Packs/day: .5    Types: Cigarettes    Quit date: 02/09/2017    Years since quitting: 5.4   Smokeless tobacco: Never  Vaping Use   Vaping Use: Never used  Substance Use Topics   Alcohol use: No   Drug use: No     Allergies   Patient has no known allergies.   Review of Systems Review of Systems: negative unless otherwise stated in HPI.      Physical Exam Triage Vital Signs ED Triage Vitals  Enc Vitals Group     BP 07/26/22 1530 117/75     Pulse Rate 07/26/22 1530 66     Resp --  Temp 07/26/22 1530 99.1 F (37.3 C)     Temp Source 07/26/22 1530 Oral     SpO2 07/26/22 1530 94 %     Weight 07/26/22 1529 130 lb (59 kg)     Height 07/26/22 1529 5\' 7"  (1.702 m)     Head Circumference --      Peak Flow --      Pain Score 07/26/22 1526 6     Pain Loc --      Pain Edu? --      Excl. in [place]? --    No data found.  Updated Vital Signs BP 117/75 (BP Location: Left Arm)   Pulse 66   Temp 99.1 F (37.3 C) (Oral)   Ht 5\' 7"  (1.702 m)   Wt 59 kg   SpO2 94%   BMI 20.36 kg/m   Visual Acuity Right Eye Distance:   Left Eye Distance:   Bilateral Distance:    Right Eye Near:   Left Eye Near:    Bilateral Near:     Physical Exam GEN:     alert, non-toxic appearing female in no distress ***   HENT:  mucus membranes moist, oropharyngeal ***without lesions or ***exudate, no*** tonsillar hypertrophy, *** mild oropharyngeal erythema , *** moderate erythematous edematous  turbinates, ***clear nasal discharge, ***bilateral TM normal EYES:   pupils equal and reactive, ***no scleral injection or discharge NECK:  normal ROM, no ***lymphadenopathy, ***no meningismus   RESP:  no increased work of breathing, ***clear to auscultation bilaterally CVS:   regular rate ***and rhythm Skin:   warm and dry, no rash on visible skin***    UC Treatments / Results  Labs (all labs ordered are listed, but only abnormal results are displayed) Labs Reviewed - No data to display  EKG   Radiology No results found.  Procedures Procedures (including critical care time)  Medications Ordered in UC Medications - No data to display  Initial Impression / Assessment and Plan / UC Course  I have reviewed the triage vital signs and the nursing notes.  Pertinent labs & imaging results that were available during my care of the patient were reviewed by me and considered in my medical decision making (see chart for details).       Pt is a 47 y.o. female who presents for *** days of respiratory symptoms. Tesla is ***afebrile here without recent antipyretics. Satting well on room air. Overall pt is ***non-toxic appearing, well hydrated, without respiratory distress. Pulmonary exam ***is unremarkable.  COVID and influenza testing obtained ***and was negative. ***Pt to quarantine until COVID test results or longer if positive.  I will call patient with test results, if positive. History consistent with ***viral respiratory illness. Discussed symptomatic treatment.  Explained lack of efficacy of antibiotics in viral disease.  Typical duration of symptoms discussed.   Return and ED precautions given and voiced understanding. Discussed MDM, treatment plan and plan for follow-up with patient/guardian*** who agrees with plan.     Final Clinical Impressions(s) / UC Diagnoses   Final diagnoses:  None   Discharge Instructions   None    ED Prescriptions   None    PDMP not reviewed  this encounter.

## 2022-07-26 NOTE — ED Triage Notes (Signed)
Pt c/o left side facial pain  Pt states she woke up at 4am with what feels like a tooth ache but she does not know which tooth. She is also having left side ear pain.   Pt is worried about a sinus infection and states that 2 weeks ago she was having bad allergies.

## 2023-01-28 ENCOUNTER — Ambulatory Visit (INDEPENDENT_AMBULATORY_CARE_PROVIDER_SITE_OTHER): Payer: 59

## 2023-01-28 ENCOUNTER — Ambulatory Visit
Admission: EM | Admit: 2023-01-28 | Discharge: 2023-01-28 | Disposition: A | Discharge status: Home / Self Care | Payer: 59 | Attending: Physician Assistant | Admitting: Physician Assistant

## 2023-01-28 DIAGNOSIS — R051 Acute cough: Secondary | ICD-10-CM

## 2023-01-28 DIAGNOSIS — B349 Viral infection, unspecified: Secondary | ICD-10-CM | POA: Diagnosis not present

## 2023-01-28 DIAGNOSIS — U071 COVID-19: Secondary | ICD-10-CM | POA: Diagnosis not present

## 2023-01-28 DIAGNOSIS — R059 Cough, unspecified: Secondary | ICD-10-CM | POA: Diagnosis not present

## 2023-01-28 MED ORDER — PROMETHAZINE-DM 6.25-15 MG/5ML PO SYRP: 5.0000 mL | ORAL_SOLUTION | Freq: Four times a day (QID) | ORAL | 0 refills | Status: AC | PRN

## 2023-01-28 NOTE — ED Triage Notes (Addendum)
Pt dx COVID pos on Saturday and been running fevers with a productive cough since. Would like to make sure she does not have pneumonia. Was prescribed paxlovid but states she is on hormone replacements and saw on a commercial that she should not take medication while on hormone replacement meds.

## 2023-01-28 NOTE — ED Provider Notes (Signed)
[facility]    CSN: [id number] Arrival date & time: 01/28/23  1657      History   Chief Complaint Chief Complaint  Patient presents with   Covid Positive   Cough    HPI Cindy Dillon is a 47 y.o. female presenting for 4 to 5-day history of cough, congestion, fever, fatigue.  She reports that she tested positive a few days ago for COVID.  States she had a telemedicine visit 4 days ago and was prescribed Paxlovid which she did not take.  She did not like the potential adverse effects.  She was seen on telemedicine visit yesterday and given benzonatate.  Patient is now worried that she could have developed pneumonia and would like to make sure she does not have pneumonia.  However, she says that she is feeling better at this time and has broken the fever.  She is not reporting any wheezing or breathing difficulty, vomiting or diarrhea.  Denies any history of cardiopulmonary disease.  No other complaints.  HPI  History reviewed. No pertinent past medical history.  There are no problems to display for this patient.   Past Surgical History:  Procedure Laterality Date   ABDOMINAL HYSTERECTOMY      OB History   No obstetric history on file.      Home Medications    Prior to Admission medications   Medication Sig Start Date End Date Taking? Authorizing Provider  benzonatate (TESSALON) 100 MG capsule Take 1 capsule (100 mg total) by mouth 3 (three) times daily as needed for cough. 01/21/16  Yes Amyot, Ali Lowe, NP  estrogens conjugated, synthetic A, (CENESTIN) 1.25 MG tablet Take 1.25 mg by mouth daily.   Yes [provider]  promethazine-dextromethorphan (PROMETHAZINE-DM) 6.25-15 MG/5ML syrup Take 5 mLs by mouth 4 (four) times daily as needed. 01/28/23  Yes Eusebio Friendly B, PA-C  albuterol (PROVENTIL HFA;VENTOLIN HFA) 108 (90 Base) MCG/ACT inhaler Inhale 2 puffs into the lungs every 6 (six) hours as needed for wheezing. 01/21/16   Sudie Grumbling, NP   cyclobenzaprine (FLEXERIL) 5 MG tablet Take 1 tablet (5 mg total) by mouth at bedtime. 01/30/22   Valinda Hoar, NP    Family History Family History  Problem Relation Age of Onset   Cancer Mother    Diabetes Maternal Grandmother    Cancer Paternal Grandmother     Social History Social History   Tobacco Use   Smoking status: Former    Current packs/day: 0.00    Types: Cigarettes    Quit date: 02/09/2017    Years since quitting: 5.9   Smokeless tobacco: Never  Vaping Use   Vaping status: Never Used  Substance Use Topics   Alcohol use: No   Drug use: No     Allergies   Patient has no known allergies.   Review of Systems Review of Systems  Constitutional:  Positive for fatigue and fever. Negative for chills and diaphoresis.  HENT:  Positive for congestion and rhinorrhea. Negative for ear pain, sinus pressure, sinus pain and sore throat.   Respiratory:  Positive for cough. Negative for shortness of breath.   Gastrointestinal:  Negative for abdominal pain, nausea and vomiting.  Musculoskeletal:  Negative for arthralgias and myalgias.  Skin:  Negative for rash.  Neurological:  Negative for weakness and headaches.  Hematological:  Negative for adenopathy.     Physical Exam Triage Vital Signs ED Triage Vitals  Encounter Vitals Group     BP 01/28/23  1822 121/84     Systolic BP Percentile --      Diastolic BP Percentile --      Pulse Rate 01/28/23 1822 67     Resp 01/28/23 1822 20     Temp 01/28/23 1822 98.6 F (37 C)     Temp src --      SpO2 01/28/23 1822 100 %     Weight --      Height --      Head Circumference --      Peak Flow --      Pain Score 01/28/23 1821 6     Pain Loc --      Pain Education --      Exclude from Growth Chart --    No data found.  Updated Vital Signs BP 121/84   Pulse 67   Temp 98.6 F (37 C)   Resp 20   SpO2 100%      Physical Exam Vitals and nursing note reviewed.  Constitutional:      General: She is not in acute  distress.    Appearance: Normal appearance. She is not ill-appearing or toxic-appearing.  HENT:     Head: Normocephalic and atraumatic.     Nose: Congestion present.     Mouth/Throat:     Mouth: Mucous membranes are moist.     Pharynx: Oropharynx is clear.  Eyes:     General: No scleral icterus.       Right eye: No discharge.        Left eye: No discharge.     Conjunctiva/sclera: Conjunctivae normal.  Cardiovascular:     Rate and Rhythm: Normal rate and regular rhythm.     Heart sounds: Normal heart sounds.  Pulmonary:     Effort: Pulmonary effort is normal. No respiratory distress.     Breath sounds: Normal breath sounds.  Musculoskeletal:     Cervical back: Neck supple.  Skin:    General: Skin is dry.  Neurological:     General: No focal deficit present.     Mental Status: She is alert. Mental status is at baseline.     Motor: No weakness.     Gait: Gait normal.  Psychiatric:        Mood and Affect: Mood normal.        Behavior: Behavior normal.        Thought Content: Thought content normal.      UC Treatments / Results  Labs (all labs ordered are listed, but only abnormal results are displayed) Labs Reviewed - No data to display  EKG   Radiology DG Chest 2 View  Result Date: 01/28/2023 CLINICAL DATA:  Cough and congestion for 5 days, COVID positive EXAM: CHEST - 2 VIEW COMPARISON:  None Available. FINDINGS: Frontal and lateral views of the chest demonstrate an unremarkable cardiac silhouette. No acute airspace disease, effusion, or pneumothorax. No acute bony abnormality. IMPRESSION: 1. No acute intrathoracic process. Electronically Signed   By: Sharlet Salina M.D.   On: 01/28/2023 19:18    Procedures Procedures (including critical care time)  Medications Ordered in UC Medications - No data to display  Initial Impression / Assessment and Plan / UC Course  I have reviewed the triage vital signs and the nursing notes.  Pertinent labs & imaging results that  were available during my care of the patient were reviewed by me and considered in my medical decision making (see chart for details).   47 y/o female  presents for cough and congestion for the past 4 to 5 days.  Tested positive for COVID.  Seen on telemedicine visit twice already.  She wants to make sure she does not have pneumonia now.  Feeling better than she was at onset.  Believes the fever is broken.  Vitals normal and stable.  She is overall well-appearing.  Has mild nasal congestion.  Chest clear.  X-ray obtained to assess for possible pneumonia.  Normal x-ray.  Results of x-ray with patient.  Reviewed supportive care.  Sent Promethazine DM.  Reviewed current CDC guidelines, isolation protocol and ED precautions.    Final Clinical Impressions(s) / UC Diagnoses   Final diagnoses:  Viral illness  Acute cough  COVID-19     Discharge Instructions      -Chest x-ray is normal.  You do not have pneumonia. - You should isolate until you are fever free for 24 hours and you are feeling better.  You likely can come out of isolation at this time but does wear your mask as long as you are coughing.  Continue benzonatate for cough.  I sent a syrup as well if you need that. - You should be seen again if you have return of fever, chest pain, increased weakness or shortness of breath.     ED Prescriptions     Medication Sig Dispense Auth. Provider   promethazine-dextromethorphan (PROMETHAZINE-DM) 6.25-15 MG/5ML syrup Take 5 mLs by mouth 4 (four) times daily as needed. 118 mL Shirlee Latch, PA-C      PDMP not reviewed this encounter.   Shirlee Latch, PA-C 01/28/23 1927

## 2023-01-28 NOTE — Discharge Instructions (Addendum)
-  Chest x-ray is normal.  You do not have pneumonia. - You should isolate until you are fever free for 24 hours and you are feeling better.  You likely can come out of isolation at this time but does wear your mask as long as you are coughing.  Continue benzonatate for cough.  I sent a syrup as well if you need that. - You should be seen again if you have return of fever, chest pain, increased weakness or shortness of breath.

## 2023-05-21 ENCOUNTER — Ambulatory Visit
Admission: EM | Admit: 2023-05-21 | Discharge: 2023-05-21 | Disposition: A | Discharge status: Home / Self Care | Payer: 59

## 2023-05-21 ENCOUNTER — Encounter: Payer: Self-pay | Admitting: Emergency Medicine

## 2023-05-21 DIAGNOSIS — K047 Periapical abscess without sinus: Secondary | ICD-10-CM

## 2023-05-21 DIAGNOSIS — K0889 Other specified disorders of teeth and supporting structures: Secondary | ICD-10-CM

## 2023-05-21 MED ORDER — CHLORHEXIDINE GLUCONATE 0.12 % MT SOLN: 10.0000 mL | Freq: Two times a day (BID) | OROMUCOSAL | 0 refills | Status: AC

## 2023-05-21 MED ORDER — PENICILLIN V POTASSIUM 500 MG PO TABS: 500.0000 mg | ORAL_TABLET | Freq: Four times a day (QID) | ORAL | 0 refills | Status: AC

## 2023-05-21 NOTE — ED Triage Notes (Signed)
 Patient c/o tooth pain x 5 days.

## 2023-05-21 NOTE — ED Provider Notes (Signed)
 [facility]    CSN: [id number] Arrival date & time: 05/21/23  1837      History   Chief Complaint Chief Complaint  Patient presents with   Dental Pain    HPI Cindy Dillon is a 48 y.o. female.   48 year old female pt, Cindy Dillon, presents to urgent care for evaliuation of left upper dental pain x 5 days.  Patient is able to eat and drink well voiding well, denies smoking.  PMH: quit smoking 2018  The history is provided by the patient. No language interpreter was used.    History reviewed. No pertinent past medical history.  Patient Active Problem List   Diagnosis Date Noted   Dental infection 05/21/2023   Dentalgia 05/21/2023    Past Surgical History:  Procedure Laterality Date   ABDOMINAL HYSTERECTOMY      OB History   No obstetric history on file.      Home Medications    Prior to Admission medications   Medication Sig Start Date End Date Taking? Authorizing Provider  chlorhexidine  (PERIDEX ) 0.12 % solution Use as directed 10 mLs in the mouth or throat 2 (two) times daily. 05/21/23  Yes Korver Graybeal, Rilla, NP  penicillin  v potassium (VEETID) 500 MG tablet Take 1 tablet (500 mg total) by mouth 4 (four) times daily for 10 days. 05/21/23 05/31/23 Yes Jesica Goheen, Rilla, NP  albuterol  (PROVENTIL  HFA;VENTOLIN  HFA) 108 (90 Base) MCG/ACT inhaler Inhale 2 puffs into the lungs every 6 (six) hours as needed for wheezing. 01/21/16   Pearl Jenkins Lesches, NP  ascorbic acid (VITAMIN C) 1000 MG tablet Take by mouth.    [provider]  benzonatate  (TESSALON ) 100 MG capsule Take 1 capsule (100 mg total) by mouth 3 (three) times daily as needed for cough. 01/21/16   Pearl Jenkins Lesches, NP  cyclobenzaprine  (FLEXERIL ) 5 MG tablet Take 1 tablet (5 mg total) by mouth at bedtime. 01/30/22   White, Shelba SAUNDERS, NP  estradiol (ESTRACE) 1 MG tablet Take 1 mg by mouth daily.    [provider]  estrogens conjugated, synthetic A, (CENESTIN) 1.25 MG tablet Take  1.25 mg by mouth daily.    [provider]  promethazine -dextromethorphan (PROMETHAZINE -DM) 6.25-15 MG/5ML syrup Take 5 mLs by mouth 4 (four) times daily as needed. 01/28/23   Arvis Jolan NOVAK, PA-C    Family History Family History  Problem Relation Age of Onset   Cancer Mother    Diabetes Maternal Grandmother    Cancer Paternal Grandmother     Social History Social History   Tobacco Use   Smoking status: Former    Current packs/day: 0.00    Types: Cigarettes    Quit date: 02/09/2017    Years since quitting: 6.2   Smokeless tobacco: Never  Vaping Use   Vaping status: Never Used  Substance Use Topics   Alcohol use: No   Drug use: No     Allergies   Patient has no known allergies.   Review of Systems Review of Systems  Constitutional:  Negative for fever.  HENT:  Positive for dental problem and sinus pressure. Negative for facial swelling.   All other systems reviewed and are negative.    Physical Exam Triage Vital Signs ED Triage Vitals  Encounter Vitals Group     BP      Systolic BP Percentile      Diastolic BP Percentile      Pulse      Resp  Temp      Temp src      SpO2      Weight      Height      Head Circumference      Peak Flow      Pain Score      Pain Loc      Pain Education      Exclude from Growth Chart    No data found.  Updated Vital Signs BP 102/72 (BP Location: Left Arm)   Pulse 70   Temp 97.8 F (36.6 C) (Oral)   Resp 16   SpO2 96%   Visual Acuity Right Eye Distance:   Left Eye Distance:   Bilateral Distance:    Right Eye Near:   Left Eye Near:    Bilateral Near:     Physical Exam Vitals and nursing note reviewed.  Constitutional:      Appearance: Normal appearance. She is well-developed and well-groomed.  HENT:     Head: Normocephalic.     Right Ear: Tympanic membrane normal.     Left Ear: Tympanic membrane normal.     Nose: Nose normal.     Mouth/Throat:     Dentition: Dental tenderness and gingival  swelling present.   Cardiovascular:     Rate and Rhythm: Normal rate and regular rhythm.     Pulses: Normal pulses.     Heart sounds: Normal heart sounds.  Pulmonary:     Effort: Pulmonary effort is normal.  Psychiatric:        Behavior: Behavior is cooperative.      UC Treatments / Results  Labs (all labs ordered are listed, but only abnormal results are displayed) Labs Reviewed - No data to display  EKG   Radiology No results found.  Procedures Procedures (including critical care time)  Medications Ordered in UC Medications - No data to display  Initial Impression / Assessment and Plan / UC Course  I have reviewed the triage vital signs and the nursing notes.  Pertinent labs & imaging results that were available during my care of the patient were reviewed by me and considered in my medical decision making (see chart for details).    Discussed exam findings and plan of care with patient,  patient verbalized understanding to this provider  Ddx: Dental pain, infection, dental abscess Final Clinical Impressions(s) / UC Diagnoses   Final diagnoses:  Dental infection  Dentalgia     Discharge Instructions      Use peridex  and penicillin  as directed, may alternate Tylenol  ibuprofen as label directed for weight-based dosing, may use topical Orajel as well as label directed.  Please follow-up with your dental provider.  If you have new or worsening issues or concerns go to the emergency room for further evaluation(fever over 101, facial swelling,unable to keep meds down,etc.     ED Prescriptions     Medication Sig Dispense Auth. Provider   chlorhexidine  (PERIDEX ) 0.12 % solution Use as directed 10 mLs in the mouth or throat 2 (two) times daily. 120 mL Isami Mehra, NP   penicillin  v potassium (VEETID) 500 MG tablet Take 1 tablet (500 mg total) by mouth 4 (four) times daily for 10 days. 40 tablet Iann Rodier, Rilla, NP      PDMP not reviewed this  encounter.   Aminta Rilla, NP 05/21/23 [facility]

## 2023-05-21 NOTE — Discharge Instructions (Addendum)
 Use peridex  and penicillin  as directed, may alternate Tylenol  ibuprofen as label directed for weight-based dosing, may use topical Orajel as well as label directed.  Please follow-up with your dental provider.  If you have new or worsening issues or concerns go to the emergency room for further evaluation(fever over 101, facial swelling,unable to keep meds down,etc.

## 2023-06-20 ENCOUNTER — Telehealth: Payer: 59 | Admitting: Family Medicine

## 2023-06-20 DIAGNOSIS — J069 Acute upper respiratory infection, unspecified: Secondary | ICD-10-CM

## 2023-06-20 NOTE — Progress Notes (Signed)
 Virtual Visit Consent   Cindy Dillon, you are scheduled for a virtual visit with a [facility] provider today. Just as with appointments in the office, your consent must be obtained to participate. Your consent will be active for this visit and any virtual visit you may have with one of our providers in the next 365 days. If you have a MyChart account, a copy of this consent can be sent to you electronically.  As this is a virtual visit, video technology does not allow for your provider to perform a traditional examination. This may limit your provider's ability to fully assess your condition. If your provider identifies any concerns that need to be evaluated in person or the need to arrange testing (such as labs, EKG, etc.), we will make arrangements to do so. Although advances in technology are sophisticated, we cannot ensure that it will always work on either your end or our end. If the connection with a video visit is poor, the visit may have to be switched to a telephone visit. With either a video or telephone visit, we are not always able to ensure that we have a secure connection.  By engaging in this virtual visit, you consent to the provision of healthcare and authorize for your insurance to be billed (if applicable) for the services provided during this visit. Depending on your insurance coverage, you may receive a charge related to this service.  I need to obtain your verbal consent now. Are you willing to proceed with your visit today? [name] has provided verbal consent on 06/20/2023 for a virtual visit (video or telephone). Loa Lamp, FNP  Date: 06/20/2023 2:24 PM  Virtual Visit via Video Note   I, Loa Lamp, connected with  Calia Napp  ([id number], 11/18/1975) on 06/20/23 at  2:15 PM EST by a video-enabled telemedicine application and verified that I am speaking with the correct person using two identifiers.  Location: Patient: Virtual Visit Location  Patient: Home Provider: Virtual Visit Location Provider: Home Office   I discussed the limitations of evaluation and management by telemedicine and the availability of in person appointments. The patient expressed understanding and agreed to proceed.    History of Present Illness: Cindy Dillon is a 48 y.o. who identifies as a female who was assigned female at birth, and is being seen today for cough, sore throat, sneezing, and head congestion, she feels like she has a cold but her mother in law has pneumonia and she wants to make sure that not contagious. No fever. Sx for 3 days.   HPI: HPI  Problems:  Patient Active Problem List   Diagnosis Date Noted   Dental infection 05/21/2023   Dentalgia 05/21/2023    Allergies: No Known Allergies Medications:  Current Outpatient Medications:    albuterol  (PROVENTIL  HFA;VENTOLIN  HFA) 108 (90 Base) MCG/ACT inhaler, Inhale 2 puffs into the lungs every 6 (six) hours as needed for wheezing., Disp: 1 Inhaler, Rfl: 0   ascorbic acid (VITAMIN C) 1000 MG tablet, Take by mouth., Disp: , Rfl:    benzonatate  (TESSALON ) 100 MG capsule, Take 1 capsule (100 mg total) by mouth 3 (three) times daily as needed for cough., Disp: 21 capsule, Rfl: 0   chlorhexidine  (PERIDEX ) 0.12 % solution, Use as directed 10 mLs in the mouth or throat 2 (two) times daily., Disp: 120 mL, Rfl: 0   cyclobenzaprine  (FLEXERIL ) 5 MG tablet, Take 1 tablet (5 mg total) by mouth at bedtime., Disp: 15 tablet,  Rfl: 0   estradiol (ESTRACE) 1 MG tablet, Take 1 mg by mouth daily., Disp: , Rfl:    estrogens conjugated, synthetic A, (CENESTIN) 1.25 MG tablet, Take 1.25 mg by mouth daily., Disp: , Rfl:    promethazine -dextromethorphan (PROMETHAZINE -DM) 6.25-15 MG/5ML syrup, Take 5 mLs by mouth 4 (four) times daily as needed., Disp: 118 mL, Rfl: 0  Observations/Objective: Patient is well-developed, well-nourished in no acute distress.  Resting comfortably  at home.  Head is normocephalic,  atraumatic.  No labored breathing.  Speech is clear and coherent with logical content.  Patient is alert and oriented at baseline.    Assessment and Plan: 1. Viral URI (Primary)  Increase fluids, humidifier at night, tylenol  or ibuprofen as directed, UC as needed. Viral illness discussed.   Follow Up Instructions: I discussed the assessment and treatment plan with the patient. The patient was provided an opportunity to ask questions and all were answered. The patient agreed with the plan and demonstrated an understanding of the instructions.  A copy of instructions were sent to the patient via MyChart unless otherwise noted below.     The patient was advised to call back or seek an in-person evaluation if the symptoms worsen or if the condition fails to improve as anticipated.    Devika Dragovich, FNP

## 2023-06-20 NOTE — Patient Instructions (Signed)

## 2023-11-17 ENCOUNTER — Ambulatory Visit: Admission: EM | Admit: 2023-11-17 | Discharge: 2023-11-17 | Disposition: A | Discharge status: Home / Self Care

## 2023-11-17 DIAGNOSIS — L0390 Cellulitis, unspecified: Secondary | ICD-10-CM | POA: Diagnosis not present

## 2023-11-17 MED ORDER — DOXYCYCLINE HYCLATE 100 MG PO CAPS: 100.0000 mg | ORAL_CAPSULE | Freq: Two times a day (BID) | ORAL | 0 refills | Status: AC

## 2023-11-17 NOTE — ED Provider Notes (Signed)
 [facility]    CSN: [id number] Arrival date & time: 11/17/23  1734      History   Chief Complaint Chief Complaint  Patient presents with   Insect Bite    HPI Cindy Dillon is a 48 y.o. female.   HPI  48 year old female with no significant past medical history presents for evaluation of increasing redness at the site of a yellowjacket sting on her right forearm as well is on the back of her right lower leg.  The sting occurred 2 days ago.  She went to her PCP yesterday and was prescribed triamcinolone cream and hydroxyzine which is not helping.  She denies any fever or drainage.  History reviewed. No pertinent past medical history.  Patient Active Problem List   Diagnosis Date Noted   Dental infection 05/21/2023   Dentalgia 05/21/2023    Past Surgical History:  Procedure Laterality Date   ABDOMINAL HYSTERECTOMY      OB History   No obstetric history on file.      Home Medications    Prior to Admission medications   Medication Sig Start Date End Date Taking? Authorizing Provider  doxycycline  (VIBRAMYCIN ) 100 MG capsule Take 1 capsule (100 mg total) by mouth 2 (two) times daily for 7 days. 11/17/23 11/24/23 Yes Bernardino Ditch, NP  estradiol (ESTRACE) 1 MG tablet Take 1 mg by mouth daily.   Yes [provider]  hydrOXYzine (ATARAX) 25 MG tablet Take 25 mg by mouth. 11/16/23 11/26/23 Yes [provider]  triamcinolone cream (KENALOG) 0.1 % Apply topically 2 (two) times daily. 11/16/23 11/15/24 Yes [provider]  albuterol  (PROVENTIL  HFA;VENTOLIN  HFA) 108 (90 Base) MCG/ACT inhaler Inhale 2 puffs into the lungs every 6 (six) hours as needed for wheezing. 01/21/16   Pearl Jenkins Lesches, NP  ascorbic acid (VITAMIN C) 1000 MG tablet Take by mouth.    [provider]  benzonatate  (TESSALON ) 100 MG capsule Take 1 capsule (100 mg total) by mouth 3 (three) times daily as needed for cough. 01/21/16   Pearl Jenkins Lesches, NP  chlorhexidine   (PERIDEX ) 0.12 % solution Use as directed 10 mLs in the mouth or throat 2 (two) times daily. 05/21/23   Defelice, Jeanette, NP  cyclobenzaprine  (FLEXERIL ) 5 MG tablet Take 1 tablet (5 mg total) by mouth at bedtime. 01/30/22   Teresa Shelba SAUNDERS, NP  estrogens conjugated, synthetic A, (CENESTIN) 1.25 MG tablet Take 1.25 mg by mouth daily.    [provider]  promethazine -dextromethorphan (PROMETHAZINE -DM) 6.25-15 MG/5ML syrup Take 5 mLs by mouth 4 (four) times daily as needed. 01/28/23   Arvis Jolan NOVAK, PA-C    Family History Family History  Problem Relation Age of Onset   Cancer Mother    Diabetes Maternal Grandmother    Cancer Paternal Grandmother     Social History Social History   Tobacco Use   Smoking status: Former    Current packs/day: 0.00    Types: Cigarettes    Quit date: 02/09/2017    Years since quitting: 6.7   Smokeless tobacco: Never  Vaping Use   Vaping status: Never Used  Substance Use Topics   Alcohol use: No   Drug use: No     Allergies   Patient has no known allergies.   Review of Systems Review of Systems  Constitutional:  Negative for fever.  Skin:  Positive for color change.     Physical Exam Triage Vital Signs ED Triage Vitals  Encounter Vitals Group  BP      Girls Systolic BP Percentile      Girls Diastolic BP Percentile      Boys Systolic BP Percentile      Boys Diastolic BP Percentile      Pulse      Resp      Temp      Temp src      SpO2      Weight      Height      Head Circumference      Peak Flow      Pain Score      Pain Loc      Pain Education      Exclude from Growth Chart    No data found.  Updated Vital Signs BP 115/72 (BP Location: Left Arm)   Pulse 67   Temp 98 F (36.7 C) (Oral)   Resp 16   SpO2 94%   Visual Acuity Right Eye Distance:   Left Eye Distance:   Bilateral Distance:    Right Eye Near:   Left Eye Near:    Bilateral Near:     Physical Exam Vitals and nursing note reviewed.   Constitutional:      Appearance: Normal appearance. She is not ill-appearing.  HENT:     Head: Normocephalic and atraumatic.  Skin:    General: Skin is warm and dry.     Capillary Refill: Capillary refill takes less than 2 seconds.     Findings: Erythema present.  Neurological:     General: No focal deficit present.     Mental Status: She is alert and oriented to person, place, and time.      UC Treatments / Results  Labs (all labs ordered are listed, but only abnormal results are displayed) Labs Reviewed - No data to display  EKG   Radiology No results found.  Procedures Procedures (including critical care time)  Medications Ordered in UC Medications - No data to display  Initial Impression / Assessment and Plan / UC Course  I have reviewed the triage vital signs and the nursing notes.  Pertinent labs & imaging results that were available during my care of the patient were reviewed by me and considered in my medical decision making (see chart for details).   Patient is a pleasant, nontoxic-appearing 48 year old female presenting for evaluation of possible cellulitis developing at the site of 2 yellowjacket stings that occurred 2 days ago.  The patient reports that she has been stung by yellow jackets in the past and has not had a similar reaction.    As you see the images above, there are significant areas of erythema, more prominent on the right forearm than on the right lower leg.  These areas are hot to touch and tender.  The erythema is blanchable.  No induration or fluctuance noted.  I suspect the patient is developing cellulitis and I will treat her for such with a 7-day course of doxycycline .  She should continue the triamcinolone ointment and hydroxyzine to help with itching.  If her symptoms not improved she is not to return for reevaluation or follow-up with her PCP.   Final Clinical Impressions(s) / UC Diagnoses   Final diagnoses:  Cellulitis, unspecified  cellulitis site     Discharge Instructions      Take the doxycycline  twice daily with food for 7 days for treatment of cellulitis from your yellowjacket stings.  Be mindful that doxycycline  will make you more prone to  sunburn so make sure that you are wearing sunscreen if you are outdoors for more than 15 minutes in the sun.  Make sure you are reapplying the sunscreen every 90 minutes.  Monitor for any increase in redness, swelling at the site of envenomation, pus drainage from the site, red streaks going up your arm or leg, or if you develop a fever.  If those conditions arise you need to return for reevaluation or see your primary care provider.  Continue using the triamcinolone and taking the hydroxyzine to help with itching.     ED Prescriptions     Medication Sig Dispense Auth. Provider   doxycycline  (VIBRAMYCIN ) 100 MG capsule Take 1 capsule (100 mg total) by mouth 2 (two) times daily for 7 days. 14 capsule Bernardino Ditch, NP      PDMP not reviewed this encounter.   Bernardino Ditch, NP 11/17/23 [phone]

## 2023-11-17 NOTE — Discharge Instructions (Addendum)
 Take the doxycycline  twice daily with food for 7 days for treatment of cellulitis from your yellowjacket stings.  Be mindful that doxycycline  will make you more prone to sunburn so make sure that you are wearing sunscreen if you are outdoors for more than 15 minutes in the sun.  Make sure you are reapplying the sunscreen every 90 minutes.  Monitor for any increase in redness, swelling at the site of envenomation, pus drainage from the site, red streaks going up your arm or leg, or if you develop a fever.  If those conditions arise you need to return for reevaluation or see your primary care provider.  Continue using the triamcinolone and taking the hydroxyzine to help with itching.

## 2023-11-17 NOTE — ED Triage Notes (Signed)
 Patient was stung by a yellow jacket on right arm and right leg. Area is spreading and hurts worse.  Went to dr yesterday and was given triamcinolone cream and hydroxyzine that isnt helping.

## 2023-11-19 LAB — COLOGUARD

## 2023-12-09 LAB — COLOGUARD: COLOGUARD: NEGATIVE

## 2024-04-13 ENCOUNTER — Inpatient Hospital Stay
Admission: RE | Admit: 2024-04-13 | Discharge: 2024-04-13 | Payer: Self-pay | Attending: Emergency Medicine | Admitting: Emergency Medicine

## 2024-04-13 VITALS — BP 100/56 | HR 78 | Temp 97.9°F | Resp 16 | Wt 124.0 lb

## 2024-04-13 DIAGNOSIS — H66003 Acute suppurative otitis media without spontaneous rupture of ear drum, bilateral: Secondary | ICD-10-CM

## 2024-04-13 MED ORDER — FLUTICASONE PROPIONATE 50 MCG/ACT NA SUSP: 1.0000 | Freq: Every day | NASAL | 0 refills | Status: AC

## 2024-04-13 MED ORDER — AMOXICILLIN-POT CLAVULANATE 875-125 MG PO TABS: 1.0000 | ORAL_TABLET | Freq: Two times a day (BID) | ORAL | 0 refills | Status: AC

## 2024-04-13 NOTE — ED Triage Notes (Signed)
 Pt c/o cough, sneezing, nasal congestion and bilateral ear pain x 5 days. She has taken Mucinex-D, Allegra and nasal spray for her symptoms.

## 2024-04-13 NOTE — Discharge Instructions (Signed)
 Take Augmentin  as prescribed.  Take Flonase as prescribed.  Drink plenty of fluids, follow-up with PCP.

## 2024-04-13 NOTE — ED Provider Notes (Signed)
 [facility]    CSN: [id number] Arrival date & time: 04/13/24  0856      History   Chief Complaint Chief Complaint  Patient presents with   Ear Fullness    I'm having bad problems with my allergies, congestion, sneezing and runny nose, not to mention my ears are hurting bad and feel full of fluid - Entered by patient   Nasal Congestion   Cough   sneezing     HPI Cindy Dillon is a 48 y.o. female.   48 year old female, Cindy Dillon, presents to urgent care for evaluation of cough sneezing nasal congestion and bilateral ear pain for 5 days.  Patient states she has been taking Mucinex D and Allegra and nasal spray for her symptoms.  Patient is flying soon and is worried about her ear pain.  The history is provided by the patient. No language interpreter was used.    History reviewed. No pertinent past medical history.  Patient Active Problem List   Diagnosis Date Noted   Acute suppurative otitis media of both ears without spontaneous rupture of tympanic membranes 04/13/2024   Dental infection 05/21/2023   Dentalgia 05/21/2023    Past Surgical History:  Procedure Laterality Date   ABDOMINAL HYSTERECTOMY      OB History   No obstetric history on file.      Home Medications    Prior to Admission medications   Medication Sig Start Date End Date Taking? Authorizing Provider  amoxicillin -clavulanate (AUGMENTIN ) 875-125 MG tablet Take 1 tablet by mouth every 12 (twelve) hours. 04/13/24  Yes Paras Kreider, Rilla, NP  ascorbic acid (VITAMIN C) 1000 MG tablet Take by mouth.   Yes [provider]  estradiol (ESTRACE) 1 MG tablet Take 1 mg by mouth daily.   Yes [provider]  fluticasone  (FLONASE ) 50 MCG/ACT nasal spray Place 1 spray into both nostrils daily. 04/13/24  Yes Tabita Corbo, Rilla, NP  albuterol  (PROVENTIL  HFA;VENTOLIN  HFA) 108 (90 Base) MCG/ACT inhaler Inhale 2 puffs into the lungs every 6 (six) hours as needed for wheezing.  01/21/16   Pearl Jenkins Lesches, NP  benzonatate  (TESSALON ) 100 MG capsule Take 1 capsule (100 mg total) by mouth 3 (three) times daily as needed for cough. 01/21/16   Pearl Jenkins Lesches, NP  chlorhexidine  (PERIDEX ) 0.12 % solution Use as directed 10 mLs in the mouth or throat 2 (two) times daily. 05/21/23   Rolene Andrades, NP  cyclobenzaprine  (FLEXERIL ) 5 MG tablet Take 1 tablet (5 mg total) by mouth at bedtime. 01/30/22   Teresa Shelba SAUNDERS, NP  estrogens conjugated, synthetic A, (CENESTIN) 1.25 MG tablet Take 1.25 mg by mouth daily.    [provider]  promethazine -dextromethorphan (PROMETHAZINE -DM) 6.25-15 MG/5ML syrup Take 5 mLs by mouth 4 (four) times daily as needed. 01/28/23   Arvis Huxley B, PA-C  triamcinolone cream (KENALOG) 0.1 % Apply topically 2 (two) times daily. 11/16/23 11/15/24  [provider]    Family History Family History  Problem Relation Age of Onset   Cancer Mother    Diabetes Maternal Grandmother    Cancer Paternal Grandmother     Social History Social History   Tobacco Use   Smoking status: Former    Current packs/day: 0.00    Types: Cigarettes    Quit date: 02/09/2017    Years since quitting: 7.1   Smokeless tobacco: Never  Vaping Use   Vaping status: Never Used  Substance Use Topics   Alcohol use: No  Drug use: No     Allergies   Other   Review of Systems Review of Systems  Constitutional:  Negative for fever.  HENT:  Positive for congestion and ear pain. Negative for ear discharge.   Respiratory:  Positive for cough.   All other systems reviewed and are negative.    Physical Exam Triage Vital Signs ED Triage Vitals  Encounter Vitals Group     BP 04/13/24 0926 (!) 100/56     Girls Systolic BP Percentile --      Girls Diastolic BP Percentile --      Boys Systolic BP Percentile --      Boys Diastolic BP Percentile --      Pulse Rate 04/13/24 0926 78     Resp 04/13/24 0926 16     Temp 04/13/24 0926 97.9 F (36.6 C)     Temp  Source 04/13/24 0926 Oral     SpO2 04/13/24 0926 95 %     Weight 04/13/24 0923 124 lb (56.2 kg)     Height --      Head Circumference --      Peak Flow --      Pain Score 04/13/24 0923 5     Pain Loc --      Pain Education --      Exclude from Growth Chart --    No data found.  Updated Vital Signs BP (!) 100/56 (BP Location: Right Arm)   Pulse 78   Temp 97.9 F (36.6 C) (Oral)   Resp 16   Wt 124 lb (56.2 kg)   SpO2 95%   BMI 19.42 kg/m   Visual Acuity Right Eye Distance:   Left Eye Distance:   Bilateral Distance:    Right Eye Near:   Left Eye Near:    Bilateral Near:     Physical Exam Vitals and nursing note reviewed.  Constitutional:      General: She is not in acute distress.    Appearance: She is well-developed.  HENT:     Head: Normocephalic.     Right Ear: Tympanic membrane is erythematous and retracted.     Left Ear: Tympanic membrane is erythematous and bulging.     Nose: Congestion present.     Mouth/Throat:     Lips: Pink.     Mouth: Mucous membranes are moist.     Pharynx: Oropharynx is clear.  Eyes:     General: Lids are normal.     Conjunctiva/sclera: Conjunctivae normal.     Pupils: Pupils are equal, round, and reactive to light.  Neck:     Trachea: No tracheal deviation.  Cardiovascular:     Rate and Rhythm: Normal rate and regular rhythm.     Heart sounds: Normal heart sounds. No murmur heard. Pulmonary:     Effort: Pulmonary effort is normal.     Breath sounds: Normal breath sounds and air entry.  Abdominal:     General: Bowel sounds are normal.     Palpations: Abdomen is soft.     Tenderness: There is no abdominal tenderness.  Musculoskeletal:        General: Normal range of motion.     Cervical back: Normal range of motion.  Lymphadenopathy:     Cervical: No cervical adenopathy.  Skin:    General: Skin is warm and dry.     Findings: No rash.  Neurological:     General: No focal deficit present.     Mental Status: She  is alert  and oriented to person, place, and time.     GCS: GCS eye subscore is 4. GCS verbal subscore is 5. GCS motor subscore is 6.  Psychiatric:        Speech: Speech normal.        Behavior: Behavior normal. Behavior is cooperative.      UC Treatments / Results  Labs (all labs ordered are listed, but only abnormal results are displayed) Labs Reviewed - No data to display  EKG   Radiology No results found.  Procedures Procedures (including critical care time)  Medications Ordered in UC Medications - No data to display  Initial Impression / Assessment and Plan / UC Course  I have reviewed the triage vital signs and the nursing notes.  Pertinent labs & imaging results that were available during my care of the patient were reviewed by me and considered in my medical decision making (see chart for details).    Discussed exam findings and plan of care with patient, scripted Augmentin  and Flonase , strict go to ER precautions given.   Patient verbalized understanding to this provider.  Ddx: Acute otitis media bilateral, otalgia, viral illness, allergies Final Clinical Impressions(s) / UC Diagnoses   Final diagnoses:  Acute suppurative otitis media of both ears without spontaneous rupture of tympanic membranes, recurrence not specified     Discharge Instructions      Take Augmentin  as prescribed.  Take Flonase as prescribed.  Drink plenty of fluids, follow-up with PCP.     ED Prescriptions     Medication Sig Dispense Auth. Provider   amoxicillin -clavulanate (AUGMENTIN ) 875-125 MG tablet Take 1 tablet by mouth every 12 (twelve) hours. 14 tablet Morrison Mcbryar, NP   fluticasone (FLONASE) 50 MCG/ACT nasal spray Place 1 spray into both nostrils daily. 16 g Kiriana Worthington, Rilla, NP      PDMP not reviewed this encounter.   Aminta Rilla, NP 04/13/24 1012
